# Patient Record
Sex: Female | Born: 1980 | Race: White | Hispanic: No | Marital: Married | Smoking: Former smoker
Health system: Southern US, Community
[De-identification: ages and names within clinical notes are randomized; demographics above are authoritative.]

## PROBLEM LIST (undated history)

## (undated) HISTORY — PX: ABDOMINOPLASTY: SUR9

## (undated) HISTORY — PX: BREAST ENHANCEMENT SURGERY: SHX7

---

## 2014-04-24 ENCOUNTER — Encounter: Payer: Self-pay | Admitting: Emergency Medicine

## 2014-04-24 ENCOUNTER — Emergency Department (INDEPENDENT_AMBULATORY_CARE_PROVIDER_SITE_OTHER): Payer: BC Managed Care – PPO

## 2014-04-24 ENCOUNTER — Emergency Department (INDEPENDENT_AMBULATORY_CARE_PROVIDER_SITE_OTHER)
Admission: EM | Admit: 2014-04-24 | Discharge: 2014-04-24 | Disposition: A | Discharge status: Home / Self Care | Payer: BC Managed Care – PPO | Source: Home / Self Care | Attending: Family Medicine | Admitting: Family Medicine

## 2014-04-24 DIAGNOSIS — M79609 Pain in unspecified limb: Secondary | ICD-10-CM

## 2014-04-24 DIAGNOSIS — S90129A Contusion of unspecified lesser toe(s) without damage to nail, initial encounter: Secondary | ICD-10-CM

## 2014-04-24 DIAGNOSIS — S90122A Contusion of left lesser toe(s) without damage to nail, initial encounter: Secondary | ICD-10-CM

## 2014-04-24 MED ORDER — MELOXICAM 15 MG PO TABS: 15.0000 mg | ORAL_TABLET | Freq: Every day | ORAL | Status: DC

## 2014-04-24 NOTE — ED Provider Notes (Signed)
CSN: [id number]     Arrival date & time 04/24/14  7619 History   First MD Initiated Contact with Patient 04/24/14 1927     Chief Complaint  Patient presents with  . Toe Injury      HPI Comments: Patient bumped her left 4th toe yesterday and has had persistent pain in the toe and distal foot.  Patient is a 33 y.o. female presenting with foot injury. The history is provided by the patient.  Foot Injury Location:  Toe and foot Time since incident:  1 day Injury: yes   Mechanism of injury comment:  Bumped toe Foot location:  L foot Toe location:  L fourth toe Pain details:    Quality:  Aching   Radiates to:  Does not radiate   Severity:  Mild   Onset quality:  Sudden   Duration:  1 day   Timing:  Constant   Progression:  Unchanged Chronicity:  New Dislocation: no   Prior injury to area:  No Relieved by:  Nothing Worsened by:  Bearing weight Ineffective treatments:  NSAIDs Associated symptoms: decreased ROM, stiffness and swelling   Associated symptoms: no muscle weakness, no numbness and no tingling     History reviewed. No pertinent past medical history. History reviewed. No pertinent past surgical history. History reviewed. No pertinent family history. History  Substance Use Topics  . Smoking status: Current Every Day Smoker -- 1.00 packs/day  . Smokeless tobacco: Not on file  . Alcohol Use: Yes     Comment: 3 q wk   OB History   Grav Para Term Preterm Abortions TAB SAB Ect Mult Living                 Review of Systems  Musculoskeletal: Positive for stiffness.    Allergies  Review of patient's allergies indicates no known allergies.  Home Medications   Prior to Admission medications   Not on File   BP 100/61  Pulse 94  Temp(Src) 98 F (36.7 C) (Oral)  Resp 18  Ht 5\' 6"  (1.676 m)  Wt 153 lb (69.4 kg)  BMI 24.71 kg/m2  SpO2 99% Physical Exam  Nursing note and vitals reviewed. Constitutional: She is oriented to person, place, and time. She appears  well-developed and well-nourished. No distress.  HENT:  Head: Atraumatic.  Eyes: Conjunctivae are normal. Pupils are equal, round, and reactive to light.  Musculoskeletal:       Left foot: She exhibits tenderness and bony tenderness. She exhibits normal range of motion, no swelling, normal capillary refill, no crepitus, no deformity and no laceration.       Feet:  Left foot has tenderness to palpation over the distal 4th metatarsal and MTP joint.  There is tenderness over the 4th toe IP joint.  Distal neurovascular function is intact.  Flexion and extension of the 4th toe is intact  Neurological: She is alert and oriented to person, place, and time.  Skin: Skin is warm and dry.    ED Course  Procedures None       Imaging Review Dg Foot Complete Left  04/24/2014   CLINICAL DATA:  Fourth toe injury with pain  EXAM: LEFT FOOT - COMPLETE 3+ VIEW  COMPARISON:  None.  FINDINGS: There is no evidence of fracture or dislocation. There is no evidence of arthropathy or other focal bone abnormality. Soft tissues are unremarkable.  IMPRESSION: Negative.   Electronically Signed   By: Marlan Palau M.D.   On: 04/24/2014 19:55  MDM   1. Contusion of fourth toe, left    Toe strapped using "buddy tape" technique.   Rx for Mobic  Buddy tape toe until pain resolves.  Apply ice pack for 15 minutes, 3 to 4 times daily  Continue until pain decreases.  Take Tylenol for pain tonight, and begin Mobic tomorrow (Tuesday) morning. Followup with Dr. Rodney Langtonhomas Thekkekand[facility] ([facility]) if not improving about two weeks.     Lattie HawStephen A Beese, MD 04/24/14 2011

## 2014-04-24 NOTE — ED Notes (Signed)
Pt c/o LT 4th toe injury x yesterday. She reports walking into a door at home yesterday.

## 2014-04-24 NOTE — Discharge Instructions (Signed)
Buddy tape toe until pain resolves.  Apply ice pack for 15 minutes, 3 to 4 times daily  Continue until pain decreases.  Take Tylenol for pain tonight, and begin Mobic tomorrow (Tuesday) morning.   Buddy Taping of Toes We have taped your toes together to keep them from moving. This is called "buddy taping" since we used a part of your own body to keep the injured part still. We placed soft padding between your toes to keep them from rubbing against each other. Buddy taping will help with healing and to reduce pain. Keep your toes buddy taped together for as long as directed by your caregiver. HOME CARE INSTRUCTIONS   Raise your injured area above the level of your heart while sitting or lying down. Prop it up with pillows.  An ice pack used every twenty minutes, while awake, for the first one to two days may be helpful. Put ice in a plastic bag and put a towel between the bag and your skin.  Watch for signs that the taping is too tight. These signs may be:  Numbness of your taped toes.  Coolness of your taped toes.  Color change in the area beyond the tape.  Increased pain.  If you have any of these signs, loosen or rewrap the tape. If you need to loosen or rewrap the buddy tape, make sure you use the padding again. SEEK IMMEDIATE MEDICAL CARE IF:   You have worse pain, swelling, inflammation (soreness), drainage or bleeding after you rewrap the tape.  Any new problems occur. MAKE SURE YOU:   Understand these instructions.  Will watch your condition.  Will get help right away if you are not doing well or get worse. Document Released: 08/21/2004 Document Revised: 02/09/2012 Document Reviewed: 11/14/2008 [facility] Patient Information 2014 [place], [place].

## 2020-01-28 ENCOUNTER — Encounter (HOSPITAL_COMMUNITY): Payer: Self-pay | Admitting: Emergency Medicine

## 2020-01-28 ENCOUNTER — Ambulatory Visit (HOSPITAL_COMMUNITY): Payer: BC Managed Care – PPO

## 2020-01-28 ENCOUNTER — Other Ambulatory Visit: Payer: Self-pay

## 2020-01-28 ENCOUNTER — Ambulatory Visit (HOSPITAL_COMMUNITY)
Admission: EM | Admit: 2020-01-28 | Discharge: 2020-01-28 | Disposition: A | Discharge status: Home / Self Care | Payer: BC Managed Care – PPO

## 2020-01-28 ENCOUNTER — Ambulatory Visit (INDEPENDENT_AMBULATORY_CARE_PROVIDER_SITE_OTHER): Payer: BC Managed Care – PPO

## 2020-01-28 DIAGNOSIS — M79645 Pain in left finger(s): Secondary | ICD-10-CM

## 2020-01-28 DIAGNOSIS — S63611A Unspecified sprain of left index finger, initial encounter: Secondary | ICD-10-CM

## 2020-01-28 DIAGNOSIS — S6992XA Unspecified injury of left wrist, hand and finger(s), initial encounter: Secondary | ICD-10-CM

## 2020-01-28 MED ORDER — NAPROXEN 500 MG PO TABS: 500.0000 mg | ORAL_TABLET | Freq: Two times a day (BID) | ORAL | 0 refills | Status: DC

## 2020-01-28 NOTE — ED Triage Notes (Signed)
Patient reports her dogs leash was wrapped around fingers of left hand and tried to run, jerking fingers in an awkward position.  This occurred today.  Left index finger is described as being pulled backwards and towards thumb at the tip of finger.  Reports numbness on side of index finger towards the middle finger.  Denies left wrist pain.

## 2020-01-28 NOTE — Discharge Instructions (Signed)
If your pain, swelling worsens or you start to have severe bluish discoloration or bruising them come back for a recheck and possible recheck on x-ray.

## 2020-01-28 NOTE — ED Provider Notes (Signed)
[place]   MRN: [id number] DOB: Oct 17, 1981  Subjective:   Alexis Ramirez is a 39 y.o. female presenting for suffering a left hand injury today while walking her dog.  Dog leash was wrapped around her 4 fingers of left hand.  Unfortunately, her dog jerked her in the wrong direction and turned her hand and a very awkward motion.  She feels like her left index finger bent out of place.  She had immediate severe pain, has had some swelling and numbness and tingling toward the end of her left finger.  Denies taking chronic medications.  No Known Allergies  History reviewed. No pertinent past medical history.   History reviewed. No pertinent surgical history.  No family history on file.  Social History   Tobacco Use  . Smoking status: Former Smoker    Packs/day: 1.00  Substance Use Topics  . Alcohol use: Yes    Comment: 3 q wk  . Drug use: No    ROS   Objective:   Vitals: BP (!) 114/56 (BP Location: Right Arm)   Pulse 85   Temp 99.1 F (37.3 C) (Oral)   Resp 18   LMP 01/07/2020 (Exact Date) Comment: husband has had vasectomy  SpO2 97%   Physical Exam Constitutional:      General: She is not in acute distress.    Appearance: Normal appearance. She is well-developed. She is not ill-appearing, toxic-appearing or diaphoretic.  HENT:     Head: Normocephalic and atraumatic.     Nose: Nose normal.     Mouth/Throat:     Mouth: Mucous membranes are moist.     Pharynx: Oropharynx is clear.  Eyes:     General: No scleral icterus.    Extraocular Movements: Extraocular movements intact.     Pupils: Pupils are equal, round, and reactive to light.  Cardiovascular:     Rate and Rhythm: Normal rate.  Pulmonary:     Effort: Pulmonary effort is normal.  Musculoskeletal:     Left hand: Swelling (Trace over proximal left index finger), tenderness (Along ulnar aspect of index finger) and bony tenderness present. No deformity or lacerations. Decreased range of motion.  Decreased strength. Normal capillary refill.  Skin:    General: Skin is warm and dry.  Neurological:     General: No focal deficit present.     Mental Status: She is alert and oriented to person, place, and time.  Psychiatric:        Mood and Affect: Mood normal.        Behavior: Behavior normal.        Thought Content: Thought content normal.        Judgment: Judgment normal.     DG Hand Complete Left  Result Date: 01/28/2020 CLINICAL DATA:  Acute LEFT hand pain following injury today. Initial encounter. EXAM: LEFT HAND - COMPLETE 3+ VIEW COMPARISON:  None. FINDINGS: There is no evidence of fracture or dislocation. There is no evidence of arthropathy or other focal bone abnormality. Soft tissues are unremarkable. IMPRESSION: Negative. Electronically Signed   By: Margarette Canada M.D.   On: 01/28/2020 17:01    Assessment and Plan :   1. Finger pain, left   2. Injury of left hand, initial encounter   3. Sprain of left index finger, unspecified site of digit, initial encounter     Radiology report is negative.  Discussed with patient using conservative management with naproxen, limiting movement and using buddy tape system.  She is very  agreeable to this.  Counseled on signs and symptoms warranting recheck including repeat imaging. Counseled patient on potential for adverse effects with medications prescribed/recommended today, ER and return-to-clinic precautions discussed, patient verbalized understanding.    Wallis Bamberg, [place] 01/28/20 1829

## 2020-05-20 ENCOUNTER — Ambulatory Visit (HOSPITAL_COMMUNITY)
Admission: EM | Admit: 2020-05-20 | Discharge: 2020-05-20 | Disposition: A | Discharge status: Home / Self Care | Payer: BC Managed Care – PPO | Attending: Emergency Medicine | Admitting: Emergency Medicine

## 2020-05-20 ENCOUNTER — Encounter (HOSPITAL_COMMUNITY): Payer: Self-pay

## 2020-05-20 ENCOUNTER — Ambulatory Visit (INDEPENDENT_AMBULATORY_CARE_PROVIDER_SITE_OTHER): Payer: BC Managed Care – PPO

## 2020-05-20 ENCOUNTER — Other Ambulatory Visit: Payer: Self-pay

## 2020-05-20 DIAGNOSIS — M79672 Pain in left foot: Secondary | ICD-10-CM

## 2020-05-20 DIAGNOSIS — S92515A Nondisplaced fracture of proximal phalanx of left lesser toe(s), initial encounter for closed fracture: Secondary | ICD-10-CM

## 2020-05-20 MED ORDER — TRAMADOL HCL 50 MG PO TABS: 50.0000 mg | ORAL_TABLET | Freq: Four times a day (QID) | ORAL | 0 refills | Status: AC | PRN

## 2020-05-20 MED ORDER — NAPROXEN 500 MG PO TABS: 500.0000 mg | ORAL_TABLET | Freq: Two times a day (BID) | ORAL | 0 refills | Status: AC

## 2020-05-20 NOTE — Discharge Instructions (Addendum)
Fracture to the base of 5th toe Ice and elevate May buddy tape toes for comfort Post op shoe Naprosyn for pain/swelling Use anti-inflammatories for pain/swelling. You may take up to 800 mg Ibuprofen every 8 hours with food. You may supplement Ibuprofen with Tylenol [phone] mg every 8 hours.   Follow up if not improving over the next 3-4 weeks

## 2020-05-20 NOTE — ED Triage Notes (Signed)
Pt states she slipped and landed on left foot wrong and not has 8/10 pain when walking on foot. Pt limped to exam room. Pt has trace swelling of anterior of left foot, Foot is ecchymotic and erythematous.

## 2020-05-20 NOTE — ED Provider Notes (Signed)
[facility]    CSN: [id number] Arrival date & time: 05/20/20  1546      History   Chief Complaint Chief Complaint  Patient presents with  . Foot Injury    HPI Alexis Ramirez is a 39 y.o. female no significant past medical history presenting today for evaluation of foot injury.  Patient reports she had been drinking last night, slipped and felt a pain in her left foot.  Since she has developed bruising and swelling around her fourth and fifth toes.  She denies any fall or awkward planting of her foot.  Was wearing flip-flops at the time.  HPI  History reviewed. No pertinent past medical history.  There are no problems to display for this patient.   Past Surgical History:  Procedure Laterality Date  . ABDOMINOPLASTY    . BREAST ENHANCEMENT SURGERY      OB History   No obstetric history on file.      Home Medications    Prior to Admission medications   Medication Sig Start Date End Date Taking? Authorizing Provider  Cyanocobalamin (VITAMIN B 12 PO) Take by mouth.    [provider]  naproxen (NAPROSYN) 500 MG tablet Take 1 tablet (500 mg total) by mouth 2 (two) times daily. 05/20/20   Tametria Aho C, PA-C  traMADol (ULTRAM) 50 MG tablet Take 1 tablet (50 mg total) by mouth every 6 (six) hours as needed for severe pain. 05/20/20   Gracelynn Bircher, Junius Creamer, PA-C    Family History No family history on file.  Social History Social History   Tobacco Use  . Smoking status: Former Smoker    Packs/day: 1.00  Substance Use Topics  . Alcohol use: Yes    Comment: occ  . Drug use: No     Allergies   Patient has no known allergies.   Review of Systems Review of Systems  Constitutional: Negative for fatigue and fever.  Eyes: Negative for visual disturbance.  Respiratory: Negative for shortness of breath.   Cardiovascular: Negative for chest pain.  Gastrointestinal: Negative for abdominal pain, nausea and vomiting.  Musculoskeletal: Positive for  arthralgias, gait problem and joint swelling.  Skin: Positive for color change. Negative for rash and wound.  Neurological: Negative for dizziness, weakness, light-headedness and headaches.     Physical Exam Triage Vital Signs ED Triage Vitals [05/20/20 1628]  Enc Vitals Group     BP (!) 101/52     Pulse Rate 78     Resp 16     Temp 98.3 F (36.8 C)     Temp Source Oral     SpO2 100 %     Weight 150 lb (68 kg)     Height 5\' 7"  (1.702 m)     Head Circumference      Peak Flow      Pain Score 5     Pain Loc      Pain Edu?      Excl. in GC?    No data found.  Updated Vital Signs BP (!) 101/52   Pulse 78   Temp 98.3 F (36.8 C) (Oral)   Resp 16   Ht 5\' 7"  (1.702 m)   Wt 150 lb (68 kg)   LMP 04/29/2020 (Approximate) Comment: denies pregnancy  SpO2 100%   BMI 23.49 kg/m   Visual Acuity Right Eye Distance:   Left Eye Distance:   Bilateral Distance:    Right Eye Near:   Left Eye Near:  Bilateral Near:     Physical Exam Vitals and nursing note reviewed.  Constitutional:      Appearance: She is well-developed.     Comments: No acute distress  HENT:     Head: Normocephalic and atraumatic.     Nose: Nose normal.  Eyes:     Conjunctiva/sclera: Conjunctivae normal.  Cardiovascular:     Rate and Rhythm: Normal rate.  Pulmonary:     Effort: Pulmonary effort is normal. No respiratory distress.  Abdominal:     General: There is no distension.  Musculoskeletal:        General: Normal range of motion.     Cervical back: Neck supple.     Comments: Left foot: Swelling and bruising noted to distal third through fifth meta tarsal extending into third through fifth toes, tender to palpation over this area, more prominent on lateral aspect/with metatarsals/toe  Dorsalis pedis 2+  Skin:    General: Skin is warm and dry.  Neurological:     Mental Status: She is alert and oriented to person, place, and time.      UC Treatments / Results  Labs (all labs ordered  are listed, but only abnormal results are displayed) Labs Reviewed - No data to display  EKG   Radiology DG Foot Complete Left  Result Date: 05/20/2020 CLINICAL DATA:  Pain and bruising over the forefoot secondary to a fall last night. EXAM: LEFT FOOT - COMPLETE 3+ VIEW COMPARISON:  Radiographs dated 04/24/2014 FINDINGS: There is a fracture of the base of the proximal phalanx of the little toe. No angulation or significant displacement. The other bones and joints of the left foot are normal. IMPRESSION: Fracture of the base of the proximal phalanx of the little toe. Electronically Signed   By: Lorriane Shire M.D.   On: 05/20/2020 17:33    Procedures Procedures (including critical care time)  Medications Ordered in UC Medications - No data to display  Initial Impression / Assessment and Plan / UC Course  I have reviewed the triage vital signs and the nursing notes.  Pertinent labs & imaging results that were available during my care of the patient were reviewed by me and considered in my medical decision making (see chart for details).     Proximal phalanx fracture of fifth toe, recommending buddy tape, postop shoe, ice, elevation and NSAIDs.  Monitor for gradual improvement.  Discussed strict return precautions. Patient verbalized understanding and is agreeable with plan.  Final Clinical Impressions(s) / UC Diagnoses   Final diagnoses:  Closed nondisplaced fracture of proximal phalanx of lesser toe of left foot, initial encounter     Discharge Instructions     Fracture to the base of 5th toe Ice and elevate May buddy tape toes for comfort Post op shoe Naprosyn for pain/swelling Use anti-inflammatories for pain/swelling. You may take up to 800 mg Ibuprofen every 8 hours with food. You may supplement Ibuprofen with Tylenol [phone] mg every 8 hours.   Follow up if not improving over the next 3-4 weeks   ED Prescriptions    Medication Sig Dispense Auth. Provider    naproxen (NAPROSYN) 500 MG tablet Take 1 tablet (500 mg total) by mouth 2 (two) times daily. 30 tablet Adarrius Graeff C, PA-C   traMADol (ULTRAM) 50 MG tablet Take 1 tablet (50 mg total) by mouth every 6 (six) hours as needed for severe pain. 10 tablet Olisa Quesnel, [place] C, PA-C     I have reviewed the PDMP during this  encounter.   Axavier Pressley, Carmi C, PA-C 05/20/20 2317

## 2020-05-21 ENCOUNTER — Ambulatory Visit (HOSPITAL_COMMUNITY): Payer: Self-pay

## 2021-03-17 IMAGING — DX DG HAND COMPLETE 3+V*L*
3 series · 3 of 3 positions shown · non-contrast
Comparison: None.

CLINICAL DATA: Acute LEFT hand pain following injury today. Initial
encounter.

EXAM:
LEFT HAND - COMPLETE 3+ VIEW

[hand pa]
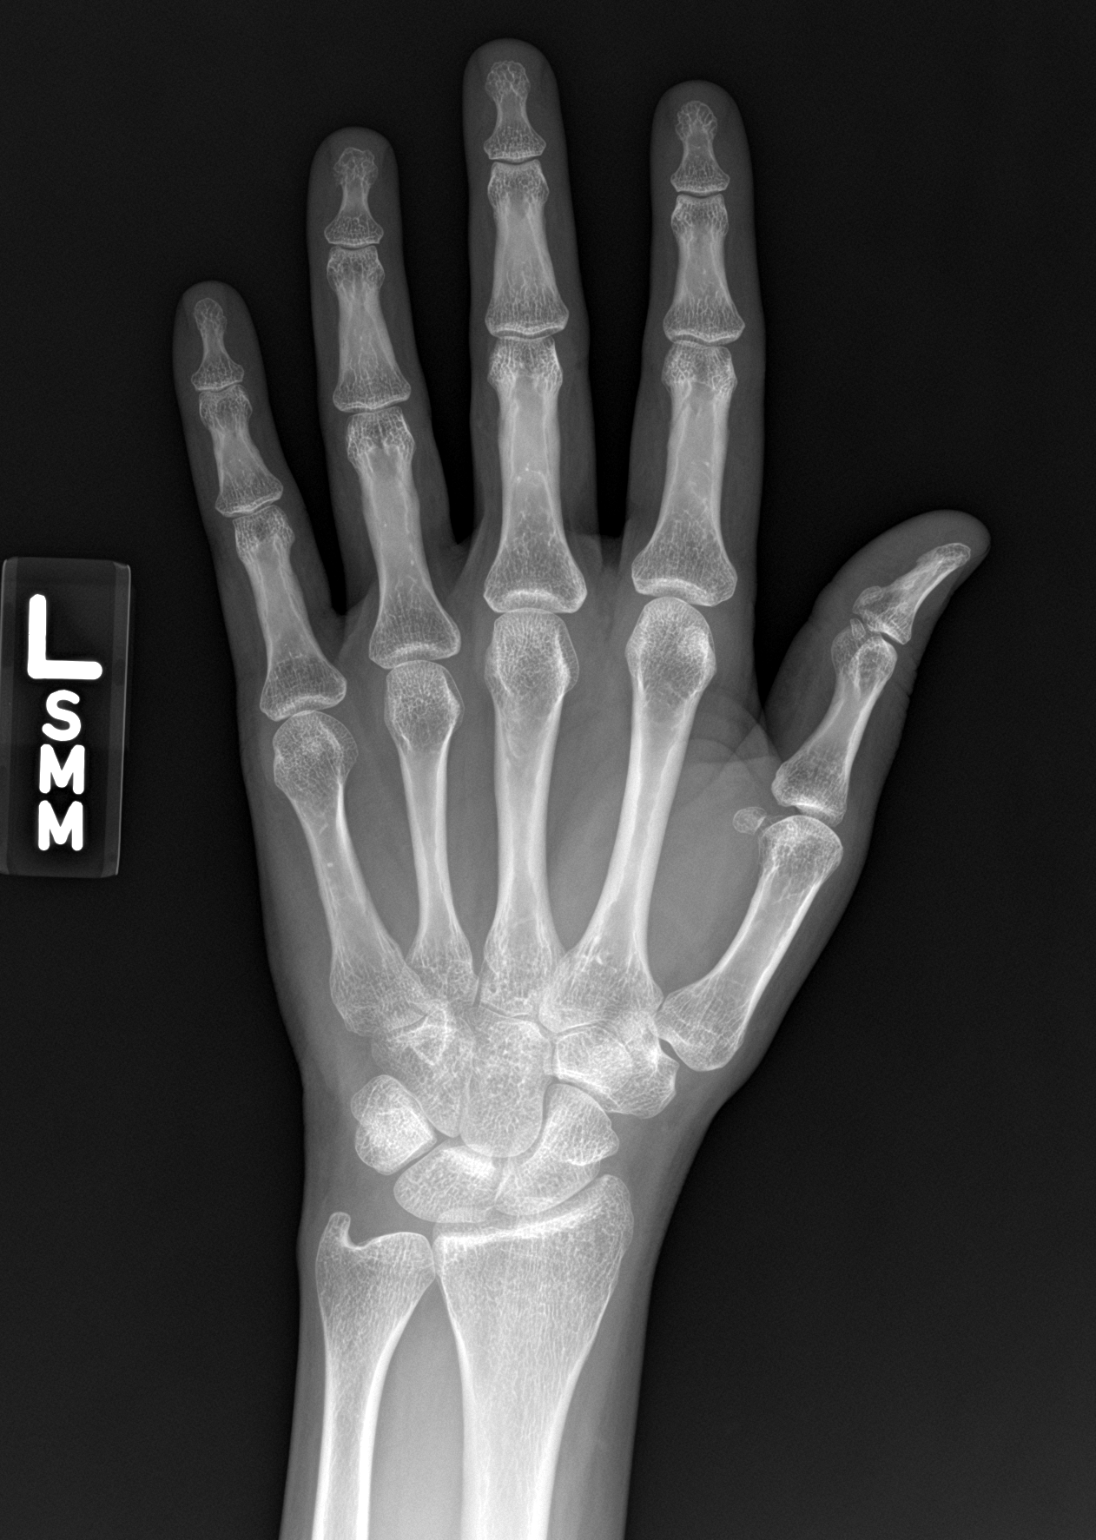

[hand obl]
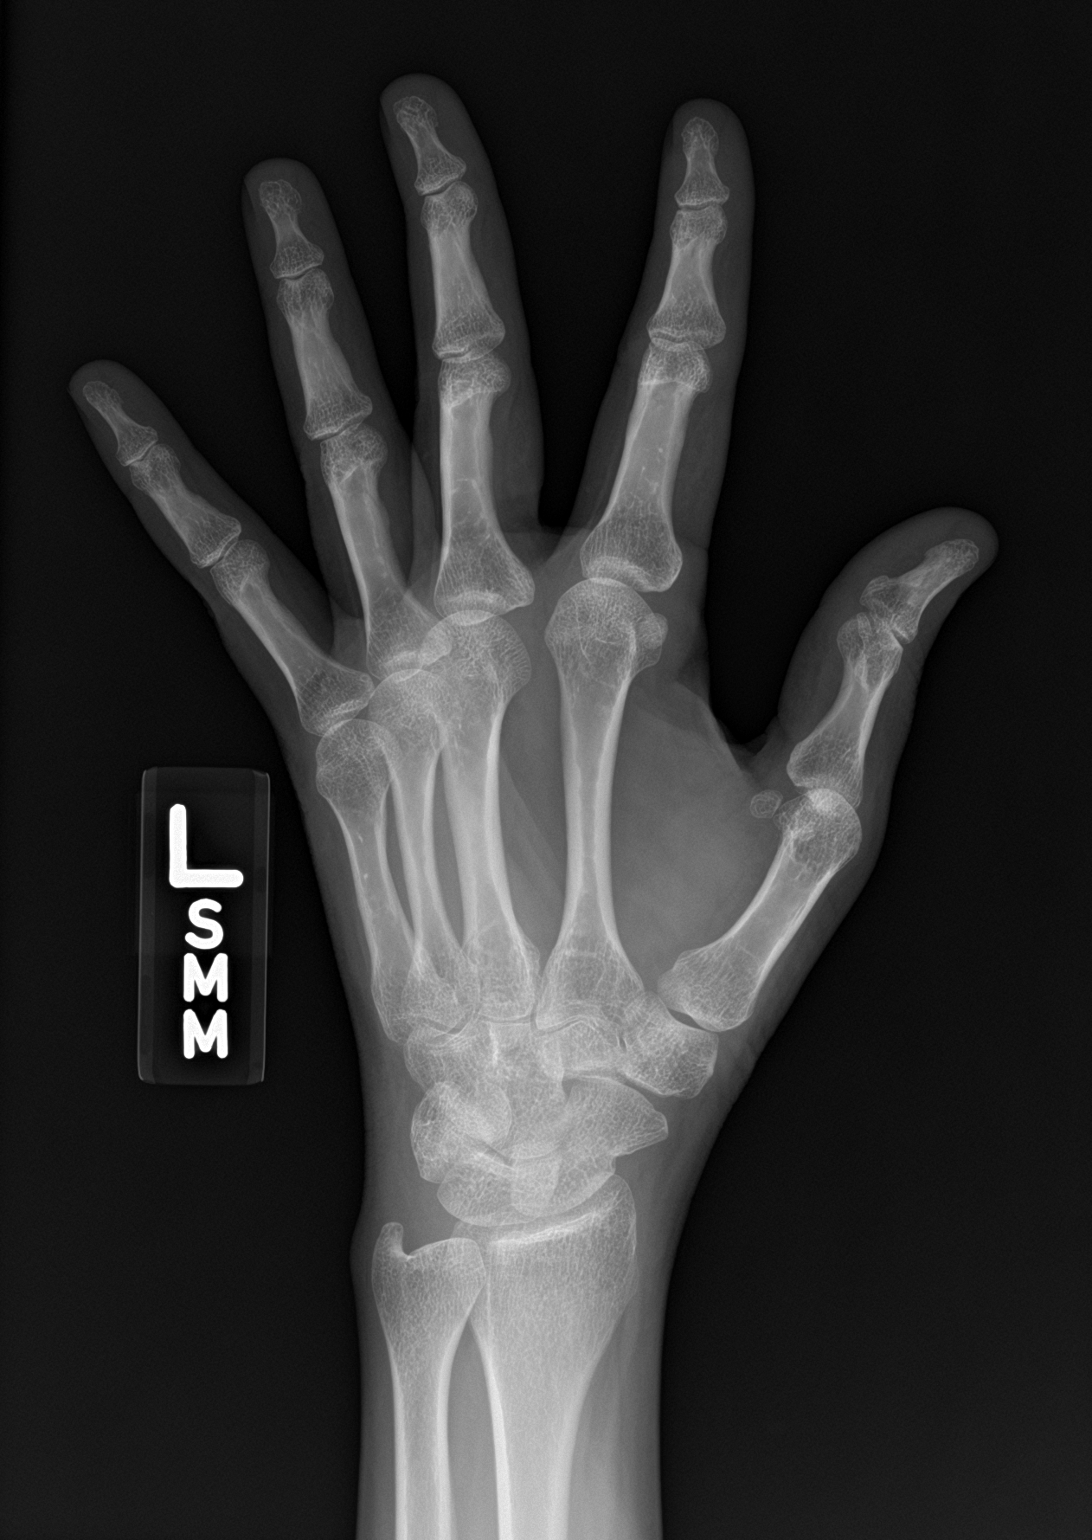

[hand lat]
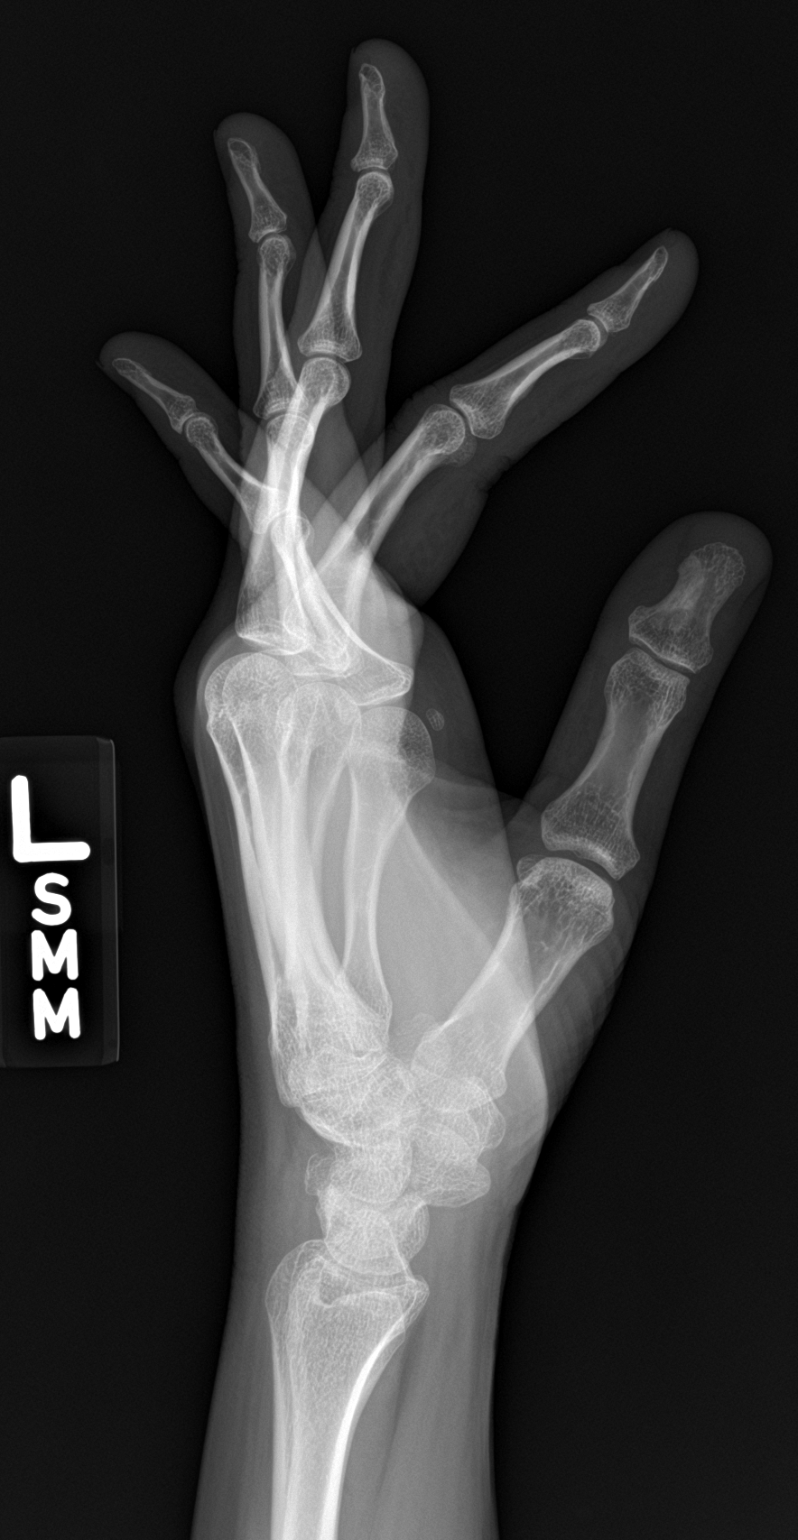

[3 of 3 positions shown; findings below may reference images not displayed]

FINDINGS: There is no evidence of fracture or dislocation. There is no
evidence of arthropathy or other focal bone abnormality. Soft
tissues are unremarkable.
IMPRESSION: Negative.

## 2021-07-08 IMAGING — DX DG FOOT COMPLETE 3+V*L*
3 series · 3 of 3 positions shown · non-contrast
Comparison: Radiographs dated 04/24/2014

CLINICAL DATA: Pain and bruising over the forefoot secondary to a
fall last night.

EXAM:
LEFT FOOT - COMPLETE 3+ VIEW

[foot ap]
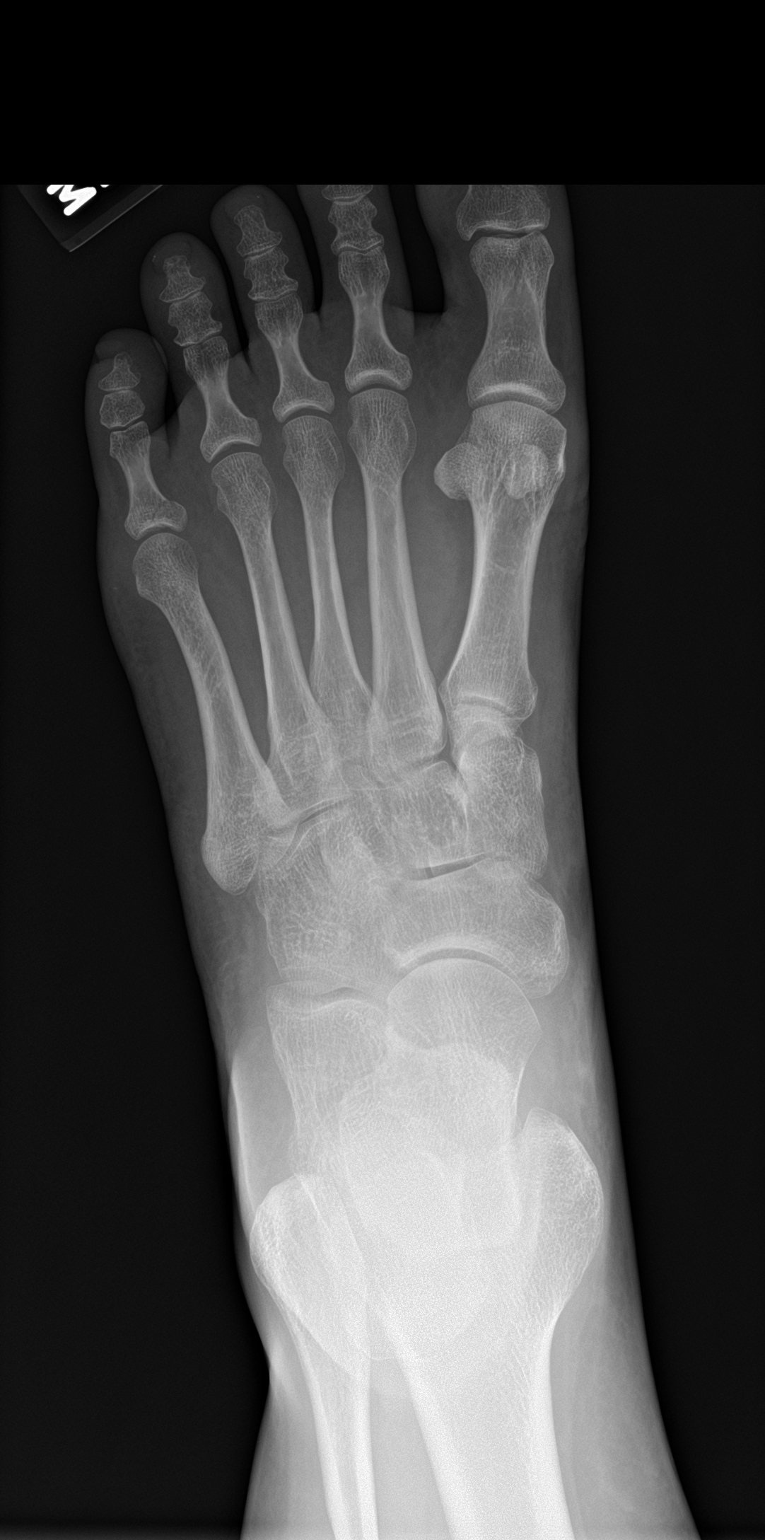

[foot obl]
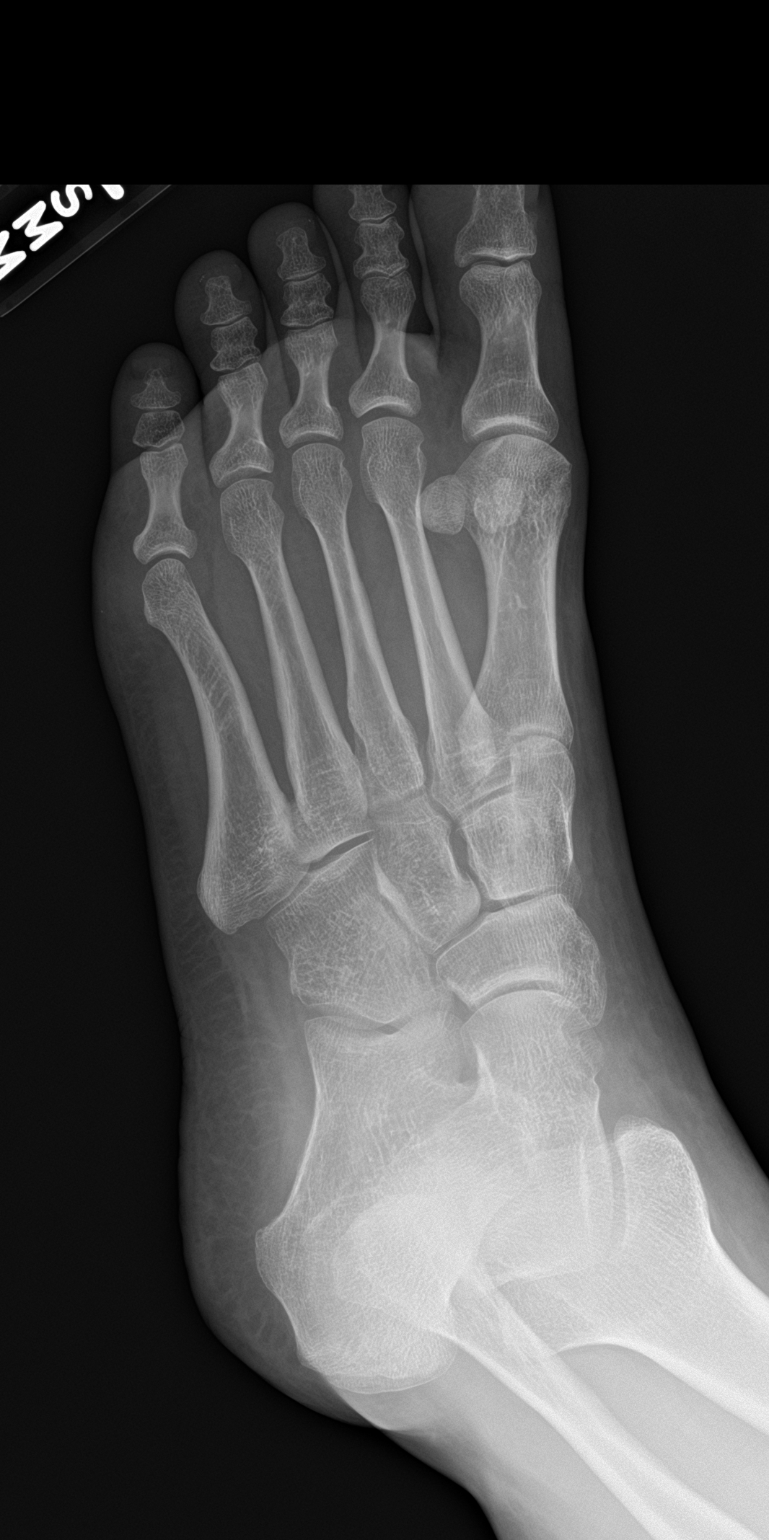

[foot lat]
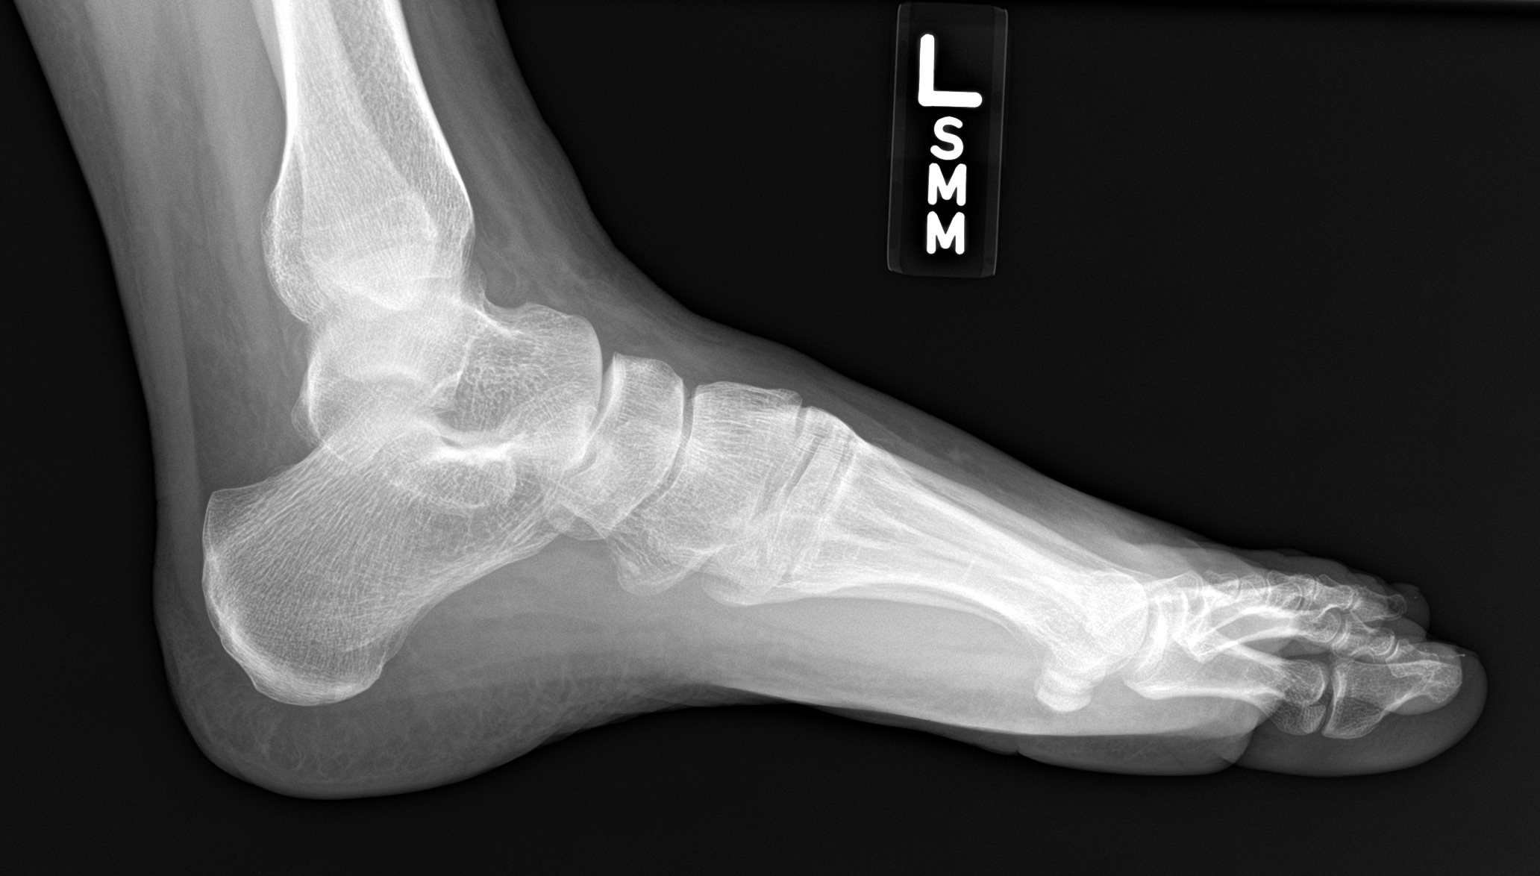

[3 of 3 positions shown; findings below may reference images not displayed]

FINDINGS: There is a fracture of the base of the proximal phalanx of the
little toe. No angulation or significant displacement. The other
bones and joints of the left foot are normal.
IMPRESSION: Fracture of the base of the proximal phalanx of the little toe.
